# Patient Record
Sex: Male | Born: 1990 | Race: Black or African American | Hispanic: No | Marital: Single | Smoking: Never smoker
Health system: Southern US, Community
[De-identification: ages and names within clinical notes are randomized; demographics above are authoritative.]

---

## 2015-08-12 ENCOUNTER — Encounter (HOSPITAL_BASED_OUTPATIENT_CLINIC_OR_DEPARTMENT_OTHER): Payer: Self-pay

## 2015-08-12 ENCOUNTER — Emergency Department (HOSPITAL_BASED_OUTPATIENT_CLINIC_OR_DEPARTMENT_OTHER)
Admission: EM | Admit: 2015-08-12 | Discharge: 2015-08-12 | Disposition: A | Discharge status: Home / Self Care | Payer: Self-pay | Attending: Emergency Medicine | Admitting: Emergency Medicine

## 2015-08-12 ENCOUNTER — Emergency Department (HOSPITAL_BASED_OUTPATIENT_CLINIC_OR_DEPARTMENT_OTHER): Payer: Self-pay

## 2015-08-12 DIAGNOSIS — S01511A Laceration without foreign body of lip, initial encounter: Secondary | ICD-10-CM | POA: Insufficient documentation

## 2015-08-12 DIAGNOSIS — T07XXXA Unspecified multiple injuries, initial encounter: Secondary | ICD-10-CM

## 2015-08-12 DIAGNOSIS — S0990XA Unspecified injury of head, initial encounter: Secondary | ICD-10-CM | POA: Insufficient documentation

## 2015-08-12 DIAGNOSIS — S60512A Abrasion of left hand, initial encounter: Secondary | ICD-10-CM | POA: Insufficient documentation

## 2015-08-12 DIAGNOSIS — Y9289 Other specified places as the place of occurrence of the external cause: Secondary | ICD-10-CM | POA: Insufficient documentation

## 2015-08-12 DIAGNOSIS — Y9389 Activity, other specified: Secondary | ICD-10-CM | POA: Insufficient documentation

## 2015-08-12 DIAGNOSIS — S60511A Abrasion of right hand, initial encounter: Secondary | ICD-10-CM | POA: Insufficient documentation

## 2015-08-12 DIAGNOSIS — Y998 Other external cause status: Secondary | ICD-10-CM | POA: Insufficient documentation

## 2015-08-12 MED ORDER — CEPHALEXIN 500 MG PO CAPS: 500.0000 mg | ORAL_CAPSULE | Freq: Two times a day (BID) | ORAL | Status: AC

## 2015-08-12 MED ORDER — LIDOCAINE HCL (PF) 1 % IJ SOLN
5.0000 mL | Freq: Once | INTRAMUSCULAR | Status: AC
  Administered 2015-08-12: 5 mL
  Filled 2015-08-12: qty 5

## 2015-08-12 MED ORDER — CEPHALEXIN 250 MG PO CAPS
500.0000 mg | ORAL_CAPSULE | Freq: Once | ORAL | Status: AC
  Administered 2015-08-12: 500 mg via ORAL
  Filled 2015-08-12: qty 2
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
## 2015-08-12 NOTE — ED Notes (Signed)
Patient transported to CT 

## 2015-08-12 NOTE — Discharge Instructions (Signed)
Head Injury, Adult You have a head injury. Headaches and throwing up (vomiting) are common after a head injury. It should be easy to wake up from sleeping. Sometimes you must stay in the hospital. Most problems happen within the first 24 hours. Side effects may occur up to 7-10 days after the injury.  WHAT ARE THE TYPES OF HEAD INJURIES? Head injuries can be as minor as a bump. Some head injuries can be more severe. More severe head injuries include:  A jarring injury to the brain (concussion).  A bruise of the brain (contusion). This mean there is bleeding in the brain that can cause swelling.  A cracked skull (skull fracture).  Bleeding in the brain that collects, clots, and forms a bump (hematoma). WHEN SHOULD I GET HELP RIGHT AWAY?   You are confused or sleepy.  You cannot be woken up.  You feel sick to your stomach (nauseous) or keep throwing up (vomiting).  Your dizziness or unsteadiness is getting worse.  You have very bad, lasting headaches that are not helped by medicine. Take medicines only as told by your doctor.  You cannot use your arms or legs like normal.  You cannot walk.  You notice changes in the black spots in the center of the colored part of your eye (pupil).  You have clear or bloody fluid coming from your nose or ears.  You have trouble seeing. During the next 24 hours after the injury, you must stay with someone who can watch you. This person should get help right away (call 911 in the [place] if you start to shake and are not able to control it (have seizures), you pass out, or you are unable to wake up. HOW CAN I PREVENT A HEAD INJURY IN THE FUTURE?  Wear seat belts.  Wear a helmet while bike riding and playing sports like football.  Stay away from dangerous activities around the house. WHEN CAN I RETURN TO NORMAL ACTIVITIES AND ATHLETICS? See your doctor before doing these activities. You should not do normal activities or play contact sports until  1 week after the following symptoms have stopped:  Headache that does not go away.  Dizziness.  Poor attention.  Confusion.  Memory problems.  Sickness to your stomach or throwing up.  Tiredness.  Fussiness.  Bothered by bright lights or loud noises.  Anxiousness or depression.  Restless sleep. MAKE SURE YOU:   Understand these instructions.  Will watch your condition.  Will get help right away if you are not doing well or get worse.   This information is not intended to replace advice given to you by your health care provider. Make sure you discuss any questions you have with your health care provider.   Document Released: 10/01/2008 Document Revised: 11/09/2014 Document Reviewed: 06/26/2013 Elsevier Interactive Patient Education 2016 [employer]  Mouth Laceration A mouth laceration is a deep cut in the lining of your mouth (mucosa). The laceration may extend into your lip or go all of the way through your mouth and cheek. Lacerations inside your mouth may involve your tongue, the insides of your cheeks, or the upper surface of your mouth (palate). Mouth lacerations may bleed a lot because your mouth has a very rich blood supply. Mouth lacerations may need to be repaired with stitches (sutures). CAUSES Any type of facial injury can cause a mouth laceration. Common causes include:  Getting hit in the mouth.  Being in a car accident. SYMPTOMS The most common sign of  a mouth laceration is bleeding that fills the mouth. DIAGNOSIS Your health care provider can diagnose a mouth laceration by examining your mouth. Your mouth may need to be washed out (irrigated) with a sterile salt-water (saline) solution. Your health care provider may also have to remove any blood clots to determine how bad your injury is. You may need X-rays of the bones in your jaw or your face to rule out other injuries, such as dental injuries, facial fractures, or jaw  fractures. TREATMENT Treatment depends on the location and severity of your injury. Small mouth lacerations may not need treatment if bleeding has stopped. You may need sutures if:  You have a tongue laceration.  Your mouth laceration is large or deep, or it continues to bleed. If sutures are necessary, your health care provider will use absorbable sutures that dissolve as your body heals. You may also receive antibiotic medicine or a tetanus shot. HOME CARE INSTRUCTIONS  Take medicines only as directed by your health care provider.  If you were prescribed an antibiotic medicine, finish all of it even if you start to feel better.  Eat as directed by your health care provider. You may only be able to drink liquids or eat soft foods for a few days.  Rinse your mouth with a warm, salt-water rinse 4-6 times per day or as directed by your health care provider. You can make a salt-water rinse by mixing one tsp of salt into two cups of warm water.  Do not poke the sutures with your tongue. Doing that can loosen them.  Check your wound every day for signs of infection. It is normal to have a white or gray patch over your wound while it heals. Watch for:  Redness.  Swelling.  Blood or pus.  Maintain regular oral hygiene, if possible. Gently brush your teeth with a soft, nylon-bristled toothbrush 2 times per day.  Keep all follow-up visits as directed by your health care provider. This is important. SEEK MEDICAL CARE IF:  You were given a tetanus shot and have swelling, severe pain, redness, or bleeding at the injection site.  You have a fever.  Your pain is not controlled with medicine.  You have redness, swelling, or pain at your wound that is getting worse.  You have fresh bleeding or pus coming from your wound.  The edges of your wound break open.  You develop swollen, tender glands in your throat. SEEK IMMEDIATE MEDICAL CARE IF:   Your face or the area under your jaw  becomes swollen.  You have trouble breathing or swallowing.   This information is not intended to replace advice given to you by your health care provider. Make sure you discuss any questions you have with your health care provider.   Document Released: 10/19/2005 Document Revised: 03/05/2015 Document Reviewed: 10/10/2014 Elsevier Interactive Patient Education 2016 [employer] Abrasion An abrasion is a cut or scrape on the outer surface of your skin. An abrasion does not extend through all of the layers of your skin. It is important to care for your abrasion properly to prevent infection. CAUSES Most abrasions are caused by falling on or gliding across the ground or another surface. When your skin rubs on something, the outer and inner layer of skin rubs off.  SYMPTOMS A cut or scrape is the main symptom of this condition. The scrape may be bleeding, or it may appear red or pink. If there was an associated fall, there may be an underlying  bruise. DIAGNOSIS An abrasion is diagnosed with a physical exam. TREATMENT Treatment for this condition depends on how large and deep the abrasion is. Usually, your abrasion will be cleaned with water and mild soap. This removes any dirt or debris that may be stuck. An antibiotic ointment may be applied to the abrasion to help prevent infection. A bandage (dressing) may be placed on the abrasion to keep it clean. You may also need a tetanus shot. HOME CARE INSTRUCTIONS Medicines  Take or apply medicines only as directed by your health care provider.  If you were prescribed an antibiotic ointment, finish all of it even if you start to feel better. Wound Care  Clean the wound with mild soap and water 2-3 times per day or as directed by your health care provider. Pat your wound dry with a clean towel. Do not rub it.  There are many different ways to close and cover a wound. Follow instructions from your health care provider about:  Wound  care.  Dressing changes and removal.  Check your wound every day for signs of infection. Watch for:  Redness, swelling, or pain.  Fluid, blood, or pus. General Instructions  Keep the dressing dry as directed by your health care provider. Do not take baths, swim, use a hot tub, or do anything that would put your wound underwater until your health care provider approves.  If there is swelling, raise (elevate) the injured area above the level of your heart while you are sitting or lying down.  Keep all follow-up visits as directed by your health care provider. This is important. SEEK MEDICAL CARE IF:  You received a tetanus shot and you have swelling, severe pain, redness, or bleeding at the injection site.  Your pain is not controlled with medicine.  You have increased redness, swelling, or pain at the site of your wound. SEEK IMMEDIATE MEDICAL CARE IF:  You have a red streak going away from your wound.  You have a fever.  You have fluid, blood, or pus coming from your wound.  You notice a bad smell coming from your wound or your dressing.   This information is not intended to replace advice given to you by your health care provider. Make sure you discuss any questions you have with your health care provider.   Document Released: 07/29/2005 Document Revised: 07/10/2015 Document Reviewed: 10/17/2014 Elsevier Interactive Patient Education [employer]
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
## 2015-08-12 NOTE — ED Notes (Signed)
MD at bedside to suture.
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
